# Patient Record
Sex: Male | Born: 1945 | Race: White | Hispanic: No | Marital: Single | Smoking: Never smoker
Health system: Southern US, Community
[De-identification: ages and names within clinical notes are randomized; demographics above are authoritative.]

---

## 2019-04-23 ENCOUNTER — Encounter (HOSPITAL_COMMUNITY): Payer: Self-pay

## 2019-04-23 ENCOUNTER — Emergency Department (HOSPITAL_COMMUNITY): Payer: Self-pay

## 2019-04-23 ENCOUNTER — Other Ambulatory Visit: Payer: Self-pay

## 2019-04-23 ENCOUNTER — Emergency Department (HOSPITAL_COMMUNITY)
Admission: EM | Admit: 2019-04-23 | Discharge: 2019-04-23 | Disposition: A | Discharge status: Home / Self Care | Payer: Self-pay | Attending: Emergency Medicine | Admitting: Emergency Medicine

## 2019-04-23 DIAGNOSIS — Y999 Unspecified external cause status: Secondary | ICD-10-CM | POA: Insufficient documentation

## 2019-04-23 DIAGNOSIS — W001XXA Fall from stairs and steps due to ice and snow, initial encounter: Secondary | ICD-10-CM | POA: Insufficient documentation

## 2019-04-23 DIAGNOSIS — Z7982 Long term (current) use of aspirin: Secondary | ICD-10-CM | POA: Insufficient documentation

## 2019-04-23 DIAGNOSIS — Y939 Activity, unspecified: Secondary | ICD-10-CM | POA: Insufficient documentation

## 2019-04-23 DIAGNOSIS — Y929 Unspecified place or not applicable: Secondary | ICD-10-CM | POA: Insufficient documentation

## 2019-04-23 DIAGNOSIS — S2241XA Multiple fractures of ribs, right side, initial encounter for closed fracture: Secondary | ICD-10-CM | POA: Insufficient documentation

## 2019-04-23 LAB — COMPREHENSIVE METABOLIC PANEL
ALT: 22 U/L (ref 0–44)
AST: 27 U/L (ref 15–41)
Albumin: 4.6 g/dL (ref 3.5–5.0)
Alkaline Phosphatase: 49 U/L (ref 38–126)
Anion gap: 10 (ref 5–15)
BUN: 19 mg/dL (ref 8–23)
CO2: 24 mmol/L (ref 22–32)
Calcium: 9.8 mg/dL (ref 8.9–10.3)
Chloride: 104 mmol/L (ref 98–111)
Creatinine, Ser: 1.18 mg/dL (ref 0.61–1.24)
GFR calc Af Amer: >60 mL/min (ref >60)
GFR calc non Af Amer: 60 mL/min — ABNORMAL LOW (ref >60)
Glucose, Bld: 123 mg/dL — ABNORMAL HIGH (ref 70–99)
Potassium: 4.4 mmol/L (ref 3.5–5.1)
Sodium: 138 mmol/L (ref 135–145)
Total Bilirubin: 0.5 mg/dL (ref 0.3–1.2)
Total Protein: 7.9 g/dL (ref 6.5–8.1)

## 2019-04-23 LAB — CBC WITH DIFFERENTIAL/PLATELET
Abs Immature Granulocytes: 0.05 10*3/uL (ref 0.00–0.07)
Basophils Absolute: 0 10*3/uL (ref 0.0–0.1)
Basophils Relative: 0 %
Eosinophils Absolute: 0 10*3/uL (ref 0.0–0.5)
Eosinophils Relative: 0 %
HCT: 49.1 % (ref 39.0–52.0)
Hemoglobin: 16.2 g/dL (ref 13.0–17.0)
Immature Granulocytes: 0 %
Lymphocytes Relative: 10 %
Lymphs Abs: 1.3 10*3/uL (ref 0.7–4.0)
MCH: 29.4 pg (ref 26.0–34.0)
MCHC: 33 g/dL (ref 30.0–36.0)
MCV: 89.1 fL (ref 80.0–100.0)
Monocytes Absolute: 0.8 10*3/uL (ref 0.1–1.0)
Monocytes Relative: 6 %
Neutro Abs: 11.4 10*3/uL — ABNORMAL HIGH (ref 1.7–7.7)
Neutrophils Relative %: 84 %
Platelets: 275 10*3/uL (ref 150–400)
RBC: 5.51 MIL/uL (ref 4.22–5.81)
RDW: 13.2 % (ref 11.5–15.5)
WBC: 13.6 10*3/uL — ABNORMAL HIGH (ref 4.0–10.5)
nRBC: 0 % (ref 0.0–0.2)

## 2019-04-23 MED ORDER — FENTANYL CITRATE (PF) 100 MCG/2ML IJ SOLN
50.0000 ug | Freq: Once | INTRAMUSCULAR | Status: AC
  Administered 2019-04-23: 50 ug via INTRAVENOUS
  Filled 2019-04-23: qty 2

## 2019-04-23 MED ORDER — HYDROCODONE-ACETAMINOPHEN 5-325 MG PO TABS: ORAL_TABLET | ORAL | 0 refills | Status: AC

## 2019-04-23 MED ORDER — IOHEXOL 300 MG/ML  SOLN
100.0000 mL | Freq: Once | INTRAMUSCULAR | Status: AC | PRN
  Administered 2019-04-23: 100 mL via INTRAVENOUS

## 2019-04-23 NOTE — ED Triage Notes (Signed)
Pt fell this morning landing on his right side. Is complaining of right rib pain. Pt is wheezing, but states this is normal due to being overweight. Alert and oriented. Ambulatory. NAD.

## 2019-04-23 NOTE — ED Provider Notes (Signed)
Pt refused inpatient treatment.  After Ct scan pt left ED.  I did pt's discharge instructions and ask RN to notify him that I had prescribed Hydocodone and to review ionstructions with pt.    Elson Areas, [place] 04/23/19 1957    Milagros Loll, MD 04/23/19 [phone]

## 2019-04-23 NOTE — Discharge Instructions (Addendum)
Return if any problems.

## 2019-04-23 NOTE — ED Provider Notes (Signed)
[facility] EMERGENCY DEPARTMENT Provider Note   CSN: [id number] Arrival date & time: 04/23/19  1422     History Chief Complaint  Patient presents with  . Chest Pain    Nathan Rowe is a 74 y.o. male.  MARCE Rowe is a 74 y.o. male who presents to the ED after a fall.  Patient reports this morning around 8 AM he went to go out his back steps and there was ice and he slipped falling landing on his right side.  He states he hit his right lower ribs on the concrete steps.  He denies hitting his head.  He denies any neck or back pain.  States pain is well localized over the right lower ribs.  He denies associated abdominal pain.  He states it is painful when he breathes but he does not feel short of breath.  He denies any bruising bleeding or scrapes.  Reports he takes aspirin but is not on any other blood thinners.  No meds prior to arrival.  No other aggravating or alleviating factors.        History reviewed. No pertinent past medical history.  There are no problems to display for this patient.   Past Surgical History:  Procedure Laterality Date  . APPENDECTOMY         No family history on file.  Social History   Tobacco Use  . Smoking status: Never Smoker  . Smokeless tobacco: Never Used  Substance Use Topics  . Alcohol use: Never  . Drug use: Never    Home Medications Prior to Admission medications   Medication Sig Start Date End Date Taking? Authorizing Provider  aspirin EC 81 MG tablet Take 81 mg by mouth daily.   Yes [provider]  Multiple Vitamin (MULTIVITAMIN) tablet Take 1 tablet by mouth daily.   Yes [provider]    Allergies    Patient has no allergy information on record.  Review of Systems   Review of Systems  Constitutional: Negative for chills and fever.  Respiratory: Negative for cough and shortness of breath.   Cardiovascular: Positive for chest pain (Rib pain).  Gastrointestinal: Negative for abdominal pain,  nausea and vomiting.  Musculoskeletal: Negative for arthralgias, back pain, myalgias and neck pain.  Skin: Negative for color change and rash.  Neurological: Negative for headaches.    Physical Exam Updated Vital Signs BP (!) 176/119 (BP Location: Right Arm)   Pulse 78   Temp 98.7 F (37.1 C) (Oral)   Resp 12   Ht 5\' 6"  (1.676 m)   Wt 97.5 kg   SpO2 98%   BMI 34.70 kg/m   Physical Exam Vitals and nursing note reviewed.  Constitutional:      General: He is not in acute distress.    Appearance: He is well-developed. He is obese. He is not ill-appearing or diaphoretic.  HENT:     Head: Normocephalic and atraumatic.  Eyes:     General:        Right eye: No discharge.        Left eye: No discharge.     Pupils: Pupils are equal, round, and reactive to light.  Neck:     Comments: No midline C-spine tenderness. Cardiovascular:     Rate and Rhythm: Normal rate and regular rhythm.     Heart sounds: Normal heart sounds. No murmur. No friction rub. No gallop.   Pulmonary:     Effort: Pulmonary effort is normal. No respiratory  distress.     Breath sounds: Normal breath sounds. No wheezing or rales.     Comments: Respirations equal and unlabored, patient able to speak in full sentences, lungs clear to auscultation bilaterally Chest:     Chest wall: Tenderness present.       Comments: Tenderness over the right lower ribs without palpable deformity, no overlying ecchymosis, no crepitus, breath sounds present. Abdominal:     General: Bowel sounds are normal. There is no distension.     Palpations: Abdomen is soft. There is no mass.     Tenderness: There is no abdominal tenderness. There is no guarding.     Comments: No right upper quadrant abdominal tenderness, abdomen nontender in all other quadrants.  Soft without rigidity  Musculoskeletal:        General: No deformity.     Cervical back: Neck supple.  Skin:    General: Skin is warm and dry.     Capillary Refill: Capillary  refill takes less than 2 seconds.  Neurological:     Mental Status: He is alert.     Coordination: Coordination normal.     Comments: Speech is clear, able to follow commands Moves extremities without ataxia, coordination intact     ED Results / Procedures / Treatments   Labs (all labs ordered are listed, but only abnormal results are displayed) Labs Reviewed - No data to display  EKG None  Radiology DG Ribs Unilateral W/Chest Right  Result Date: 04/23/2019 CLINICAL DATA:  Fall. Right anterior rib pain. EXAM: RIGHT RIBS AND CHEST - 3+ VIEW COMPARISON:  None FINDINGS: There are acute fractures involving the anterior aspect of the right 8, ninth and tenth ribs. Normal heart size. Aortic atherosclerosis. There is no pleural effusion or edema. No airspace opacities. IMPRESSION: Acute fractures involve the anterior aspect of the right eighth, ninth and tenth ribs. Electronically Signed   By: Kerby Moors M.D.   On: 04/23/2019 15:53    Procedures Procedures (including critical care time)  Medications Ordered in ED Medications  fentaNYL (SUBLIMAZE) injection 50 mcg (has no administration in time range)    ED Course  I have reviewed the triage vital signs and the nursing notes.  Pertinent labs & imaging results that were available during my care of the patient were reviewed by me and considered in my medical decision making (see chart for details).    MDM Rules/Calculators/A&P                      74 year old male presents after a fall at 8 AM this morning, striking his right lower ribs on concrete steps.  He did not hit his head or have any loss of consciousness, denies any neck or back pain.  Patient is focally tender over the right lower limbs without tenderness or signs of injury elsewhere.  Breath sounds present without signs of pneumothorax.  Satting 98% on room air without tachypnea or increased respiratory effort.  Will get rib x-ray with chest view.  X-ray shows acute  fractures of the anterior aspect of the eighth, ninth and 10th ribs, given 3 continuous rib fractures in a 74 year old gentleman this has high risk for mortality, will get CT imaging of the chest abdomen and pelvis to assess for further injury from fall.  We will also get basic labs and give fentanyl for pain.  Patient is agreeable with this plan, states that if he has no injuries beyond the rib fractures though he does  not want to stay in the hospital and wants to be discharged home, but if any additional injuries are found we will rediscuss.  At shift change care signed out to [facility] who will follow up on CT imaging and labs and disposition appropriately.   Final Clinical Impression(s) / ED Diagnoses Final diagnoses:  Closed fracture of multiple ribs of right side, initial encounter    Rx / DC Orders ED Discharge Orders    None       Legrand Rams 04/23/19 1615    Linwood Dibbles, MD 04/24/19 [phone]

## 2019-10-04 DEATH — deceased

## 2020-09-09 IMAGING — CT CT CHEST W/ CM
3 of 5 series · 15 of 36 positions shown, 17 images · IV contrast (Omnipaque or Isovue)
Comparison: Chest radiograph dated 04/23/2019.

CLINICAL DATA: 74-year-old male with fall and right sided rib pain.

EXAM:
CT CHEST, ABDOMEN, AND PELVIS WITH CONTRAST
TECHNIQUE: Multidetector CT imaging of the chest, abdomen and pelvis was
performed following the standard protocol during bolus
administration of intravenous contrast.
CONTRAST:  100mL OMNIPAQUE IOHEXOL 300 MG/ML  SOLN

[Series 2: cap with · axial · 0.98mm/px · z∈[+630,+1135]mm · 8 of 131 slices shown, 10 images]
[im 15/131  mediastinal]
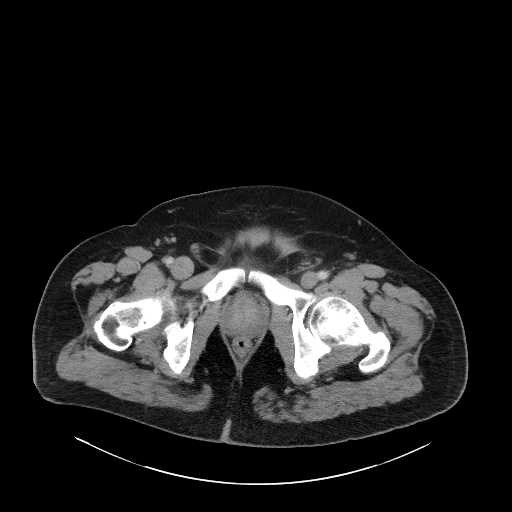
[im 15/131  lung]
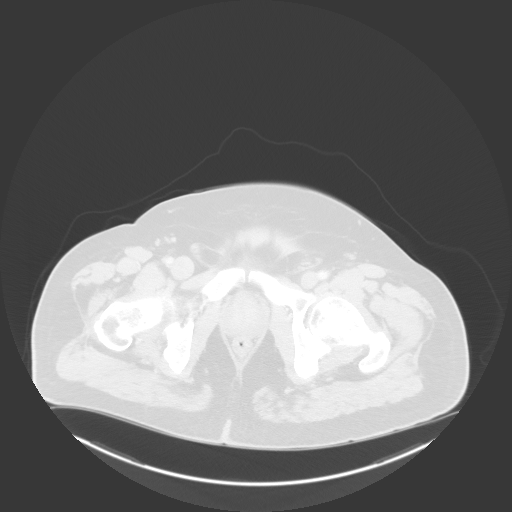
[im 29/131  lung]
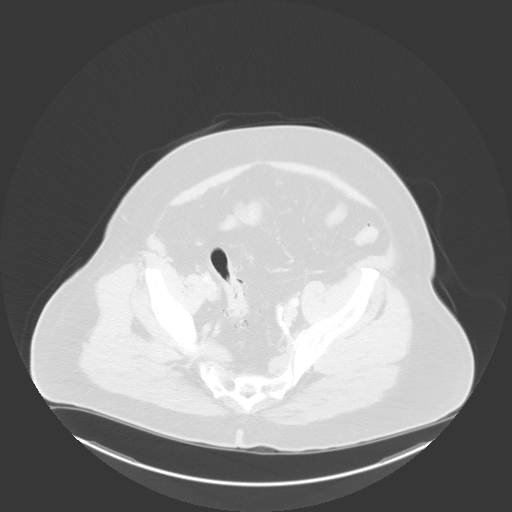
[im 44/131  lung]
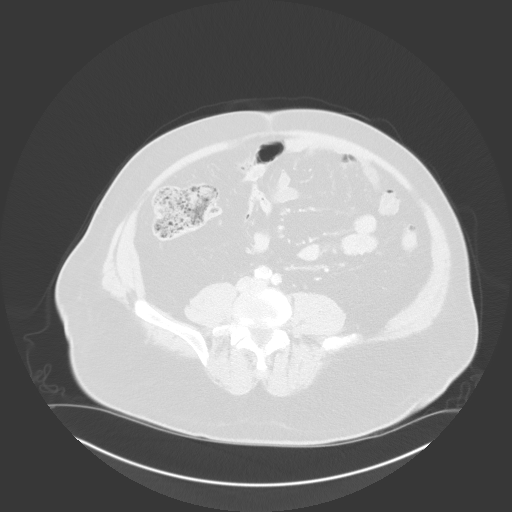
[im 58/131  lung]
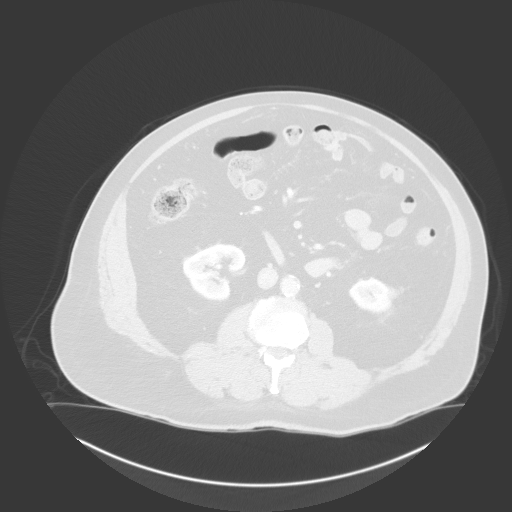
[im 73/131  mediastinal]
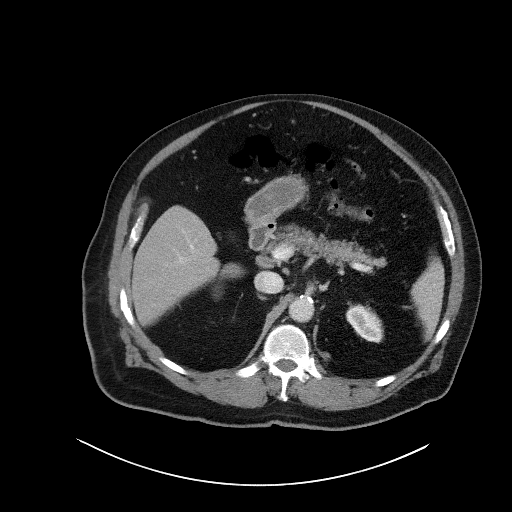
[im 73/131  lung]
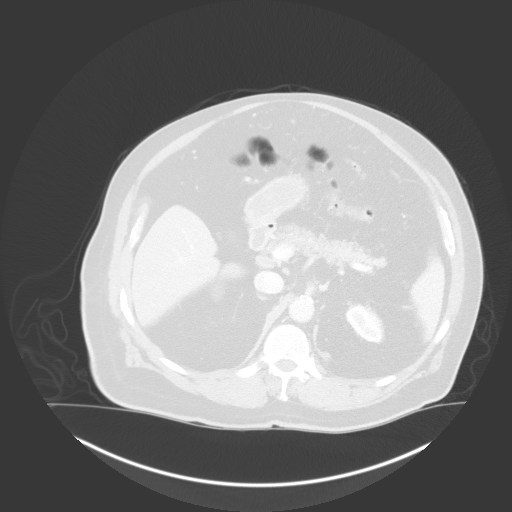
[im 87/131  lung]
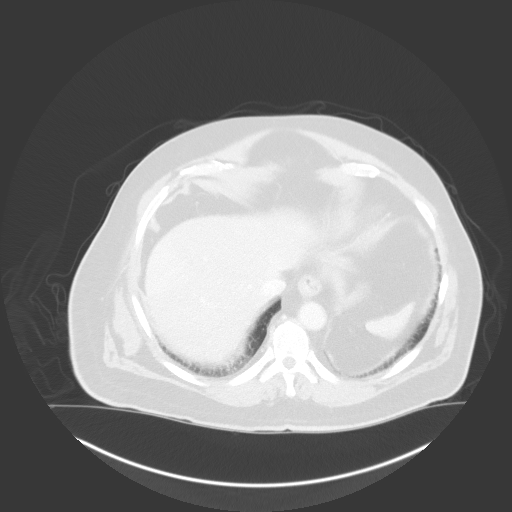
[im 102/131  lung]
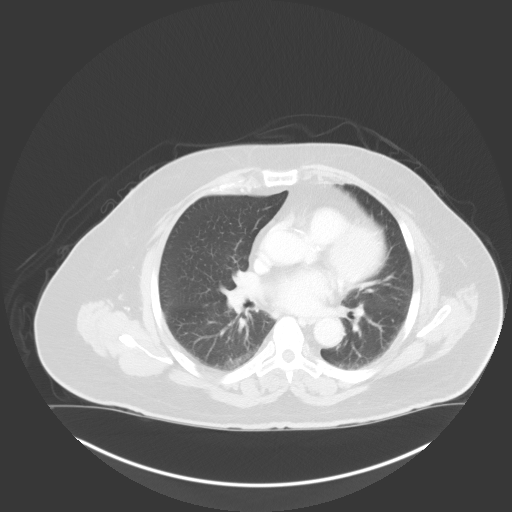
[im 116/131  lung]
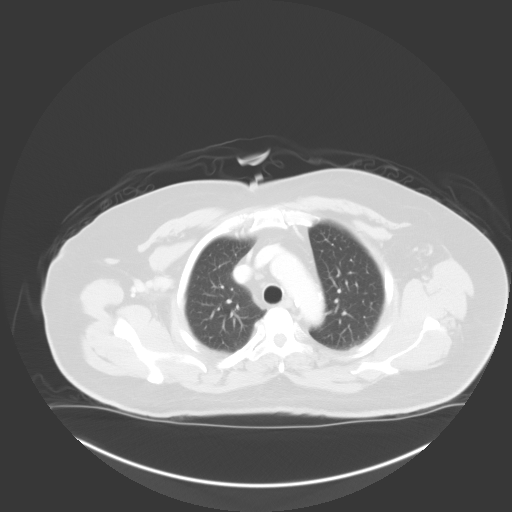

[Series 4: lung · axial · 0.98mm/px · z∈[+894,+1024]mm · 4 of 172 slices shown]
[im 14/172  lung]
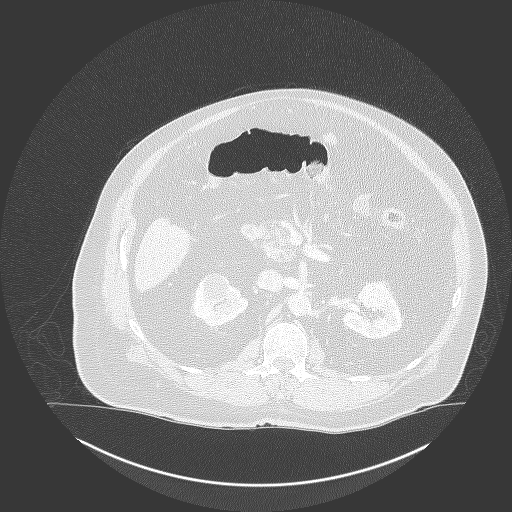
[im 40/172  lung]
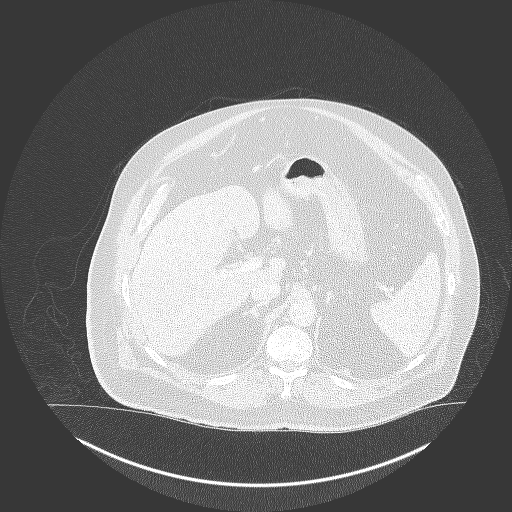
[im 53/172  lung]
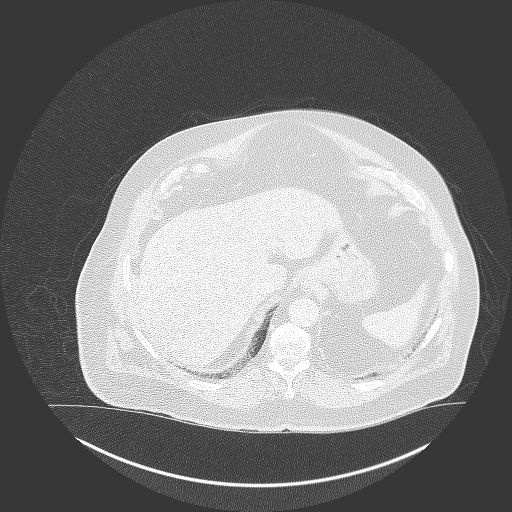
[im 79/172  lung]
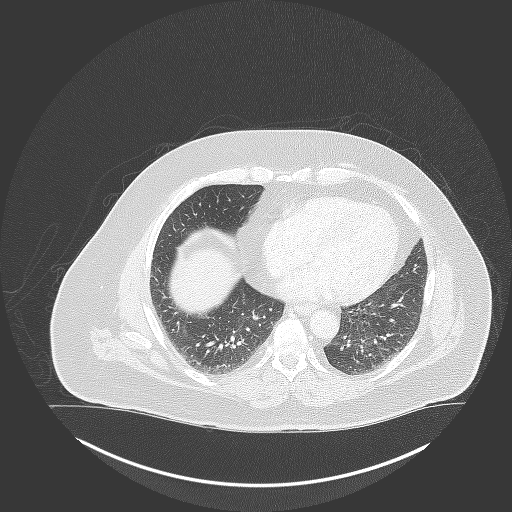

[Series 5: coronals · coronal · 0.91mm/px · 3 of 183 slices shown]
[im 37/183  lung]
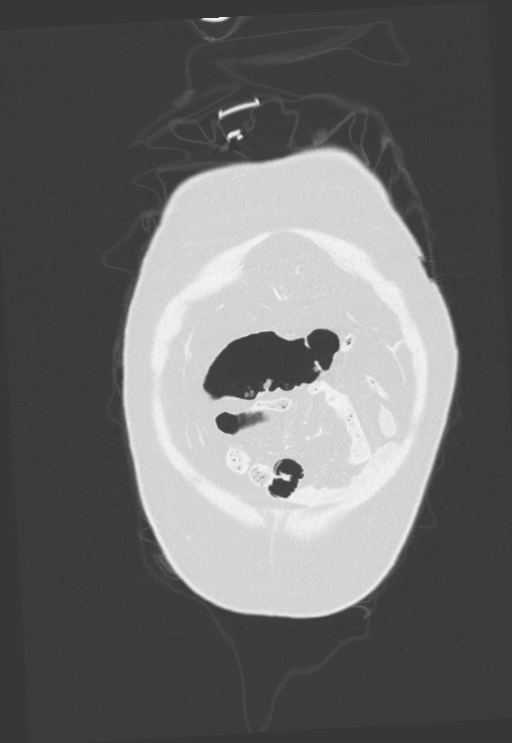
[im 73/183  lung]
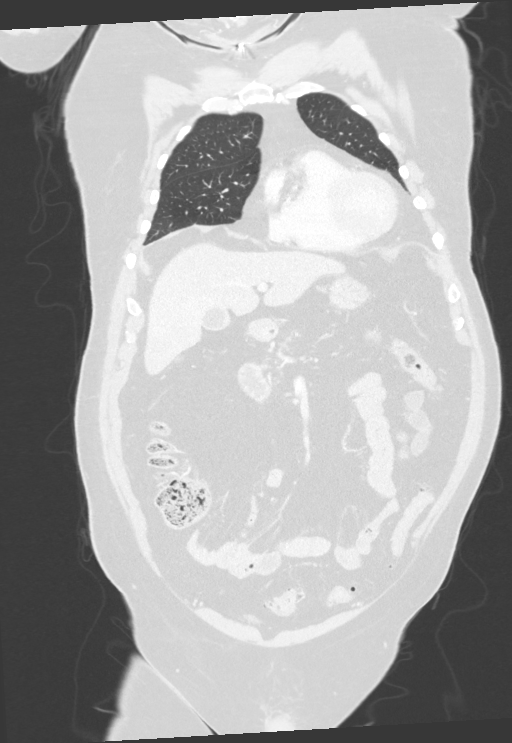
[im 110/183  lung]
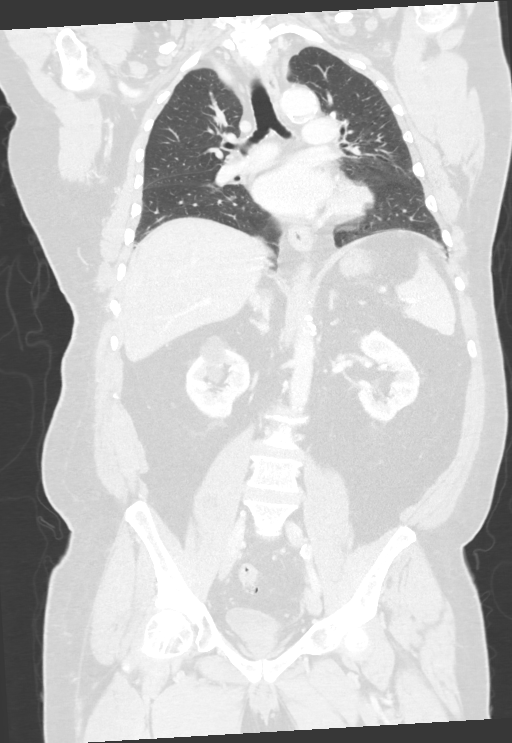

[15 of 36 positions shown; findings below may reference images not displayed]

FINDINGS: CT CHEST FINDINGS

Cardiovascular: There is no cardiomegaly or pericardial effusion.
There is calcification of the aortic valve annulus. Coronary
vascular calcification primarily involving the LAD. There is
moderate atherosclerotic calcification of thoracic aorta. No
aneurysmal dilatation or dissection. The origins of the great
vessels of the aortic arch appear patent as visualized. The central
pulmonary arteries appear patent.

Mediastinum/Nodes: There is no hilar or mediastinal adenopathy. The
esophagus is grossly unremarkable. No mediastinal fluid collection.

Lungs/Pleura: The lungs are clear. There is no pleural effusion or
pneumothorax. The central airways are patent.

Musculoskeletal: Minimally displaced fractures of the lateral aspect
of the right eighth-tenth ribs. No other acute osseous pathology.

CT ABDOMEN PELVIS FINDINGS

No intra-abdominal free air or free fluid.

Hepatobiliary: There is diffuse fatty infiltration of the liver. No
intrahepatic biliary ductal dilatation. No calcified gallstone or
pericholecystic fluid.

Pancreas: Unremarkable. No pancreatic ductal dilatation or
surrounding inflammatory changes.

Spleen: Normal in size without focal abnormality.

Adrenals/Urinary Tract: The adrenal glands are unremarkable. There
is no hydronephrosis on either side. There is symmetric enhancement
and excretion of contrast by both kidneys. There is a 3 cm right
renal upper pole cyst. The visualized ureters and urinary bladder
appear unremarkable.

Stomach/Bowel: There is severe sigmoid diverticulosis and scattered
colonic diverticula without active inflammatory changes. There is no
bowel obstruction or active inflammation. Appendectomy.

Vascular/Lymphatic: Moderate aortoiliac atherosclerotic disease. The
IVC is unremarkable. No portal venous gas. There is no adenopathy.

Reproductive: The prostate and seminal vesicles are grossly
unremarkable.

Other: Small fat containing umbilical hernia.

Musculoskeletal: No acute or significant osseous findings.
IMPRESSION: 1. Fractures of the lateral aspect of the right eighth-tenth ribs.
No pneumothorax.
2. Coronary vascular calcification and Aortic Atherosclerosis
([id number].T).
3. Fatty liver.
4. Colonic diverticulosis.
